# Patient Record
Sex: Male | Born: 2000 | Race: White | Hispanic: Yes | Marital: Single | Smoking: Former smoker
Health system: Southern US, Community
[De-identification: ages and names within clinical notes are randomized; demographics above are authoritative.]

---
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
## 2019-04-15 ENCOUNTER — Telehealth: Payer: Self-pay | Admitting: Pediatrics

## 2019-04-15 NOTE — Telephone Encounter (Signed)
Pre-screening for onsite visit ° °1. Who is bringing the patient to the visit? Patient  ° °Informed only one adult can bring patient to the visit to limit possible exposure to COVID19 and facemasks must be worn while in the building by the patient (ages 2 and older) and adult. ° °2. Has the person bringing the patient or the patient been around anyone with suspected or confirmed COVID-19 in the last 14 days? No ° °3. Has the person bringing the patient or the patient been around anyone who has been tested for COVID-19 in the last 14 days? {No ° °4. Has the person bringing the patient or the patient had any of these symptoms in the last 14 days? No  ° °Fever (temp 100 F or higher) °Breathing problems °Cough °Sore throat °Body aches °Chills °Vomiting °Diarrhea °Loss of taste or smell ° ° °If all answers are negative, advise patient to call our office prior to your appointment if you or the patient develop any of the symptoms listed above. °  °If any answers are yes, cancel in-office visit and schedule the patient for a same day telehealth visit with a provider to discuss the next steps. °

## 2019-04-16 ENCOUNTER — Other Ambulatory Visit: Payer: Self-pay

## 2019-04-16 ENCOUNTER — Ambulatory Visit (INDEPENDENT_AMBULATORY_CARE_PROVIDER_SITE_OTHER): Payer: Medicaid Other | Admitting: Pediatrics

## 2019-04-16 ENCOUNTER — Other Ambulatory Visit (HOSPITAL_COMMUNITY)
Admission: RE | Admit: 2019-04-16 | Discharge: 2019-04-16 | Disposition: A | Discharge status: Home / Self Care | Payer: Medicaid Other | Source: Ambulatory Visit | Attending: Pediatrics | Admitting: Pediatrics

## 2019-04-16 ENCOUNTER — Encounter: Payer: Self-pay | Admitting: Pediatrics

## 2019-04-16 VITALS — BP 118/70 | HR 72 | Ht 68.35 in | Wt 288.2 lb

## 2019-04-16 DIAGNOSIS — Z113 Encounter for screening for infections with a predominantly sexual mode of transmission: Secondary | ICD-10-CM | POA: Insufficient documentation

## 2019-04-16 DIAGNOSIS — Z6841 Body Mass Index (BMI) 40.0 and over, adult: Secondary | ICD-10-CM | POA: Diagnosis not present

## 2019-04-16 DIAGNOSIS — L83 Acanthosis nigricans: Secondary | ICD-10-CM

## 2019-04-16 DIAGNOSIS — L739 Follicular disorder, unspecified: Secondary | ICD-10-CM | POA: Diagnosis not present

## 2019-04-16 DIAGNOSIS — Z0001 Encounter for general adult medical examination with abnormal findings: Secondary | ICD-10-CM

## 2019-04-16 DIAGNOSIS — H579 Unspecified disorder of eye and adnexa: Secondary | ICD-10-CM | POA: Diagnosis not present

## 2019-04-16 LAB — POCT RAPID HIV: Rapid HIV, POC: NEGATIVE

## 2019-04-16 MED ORDER — MUPIROCIN 2 % EX OINT: 1.0000 "application " | TOPICAL_OINTMENT | Freq: Two times a day (BID) | CUTANEOUS | 0 refills | Status: AC

## 2019-04-16 NOTE — Progress Notes (Signed)
Adolescent Well Care Visit Randy Snyder is a 19 y.o. male who is here for well care.    PCP:  Carmie End, MD   History was provided by the patient.  Current Issues: Current concerns include weight gain.  Nutrition: Nutrition/Eating Behaviors: trying to drink more water this past week. Doesn't eat many fruits/ veggies, lots of junk food at home Adequate calcium in diet?: milk sometimes Supplements/ Vitamins: none  Exercise/ Media: Play any Sports?/ Exercise: basketball, wants to start doing more exercise.  Sleep:  Sleep: all night, sometimes snores.  Social Screening: Lives with:  Mother and step-father and siblings Parental relations:  good Activities, Work, and Research officer, political party?: working at [employer] regarding behavior with peers?  no Stressors of note: yes - [place] pandemic  Education: Graduated high school.  Thinking about going to [employer] - maybe nursing or phlebotomy.    Confidential Social History: Tobacco?  no Secondhand smoke exposure?  no Drugs/ETOH?  Yes, occasional alcohol with friends, no binge drinking  Sexually Active?  yes  - 2 lifetime male partners, none currently Pregnancy Prevention: condoms  Screenings: Patient has a dental home: yes - needs appointment  The patient completed the Rapid Assessment for Adolescent Preventive Services screening questionnaire and the following topics were identified as risk factors and discussed: healthy eating and exercise  In addition, the following topics were discussed as part of anticipatory guidance condom use and mental health issues.  PHQ-9 completed and results indicated no signs of depression.  Physical Exam:  Vitals:   04/16/19 1336  BP: 118/70  Pulse: 72  Weight: 288 lb 3.2 oz (130.7 kg)  Height: 5' 8.35" (1.736 m)   BP 118/70   Pulse 72   Ht 5' 8.35" (1.736 m)   Wt 288 lb 3.2 oz (130.7 kg)   BMI 43.38 kg/m  Body mass index: body mass index is 43.38 kg/m.    Hearing  Screening   125Hz  250Hz  500Hz  1000Hz  2000Hz  3000Hz  4000Hz  6000Hz  8000Hz   Right ear:   20 20 20 20 20     Left ear:   20 20 20 20 20       Visual Acuity Screening   Right eye Left eye Both eyes  Without correction: 10/25 10/20 10/25   With correction:       General Appearance:   alert, oriented, no acute distress and obese  HENT: Normocephalic, no obvious abnormality, conjunctiva clear  Mouth:   Normal appearing teeth, no obvious discoloration, dental caries, or dental caps  Neck:   Supple; thyroid: no enlargement, symmetric, no tenderness/mass/nodules  Chest Normal male  Lungs:   Clear to auscultation bilaterally, normal work of breathing  Heart:   Regular rate and rhythm, S1 and S2 normal, no murmurs;   Abdomen:   Soft, non-tender, no mass, or organomegaly  GU normal male genitals, no testicular masses or hernia, Tanner stage V  Musculoskeletal:   Tone and strength strong and symmetrical, all extremities               Lymphatic:   No cervical adenopathy  Skin/Hair/Nails:   Striae present on the arms and abdomen, thickened hyperpigmented skin on the posterior neck and both axillae. There is a pustule on the right anterior thigh just lateral to the inguinal crease with a 3-4 cm diameter surrounding oval-shaped patch of erythema.  No induration or fluctuance.   Neurologic:   Strength, gait, and coordination normal and age-appropriate     Assessment and Plan:  1. Encounter for general adult medical examination with abnormal findings Discussed transition to adult care.  He is ready to transition and is interested in transferring care to the [facility].  Information given to call to schedule appointment and referral placed.    2. Routine screening for STI (sexually transmitted infection) - Urine cytology ancillary only - POCT Rapid HIV - negative  3. Class 3 severe obesity due to excess calories with body mass index (BMI) of 40.0 to 44.9 in adult, unspecified  whether serious comorbidity present (HCC) Rapid weight gain over the past year.  Will obtain obesity screening labs today.  Recommend gradually restarting exercise routine and work to decrease sugary and junk foods in diet.   - ALT - AST - Hemoglobin A1c - Lipid panel - TSH  4. Acanthosis nigricans Present on the neck, axillae, and groin.  Discussed with patient.  Will obtain HgbA1C today. - Hemoglobin A1c  5. Abnormal vision screen Follow-up with eye doctor as scheduled  6. Folliculitis On the right thigh.  No signs of abscess or cellulitis at this time.  Rx mupirocin ointment.  Supportive cares and return precautions reviewed. - mupirocin ointment (BACTROBAN) 2 %; Apply 1 application topically 2 (two) times daily. For skin infection  Dispense: 22 g; Refill: 0  Hearing screening result:normal Vision screening result: abnormal - has appt with eye doctor next week    Return for call to make a new patient appointment with family medicine in the next 1-2 months.Clifton Custard, MD

## 2019-04-16 NOTE — Patient Instructions (Signed)
As your medical provider, it is important to me that you continue to receive high-quality primary care services as you transition to adulthood.  After the age of 88, you can no longer be seen at the [facility] for your primary care health services.   Below is a list of adult medicine practices that are currently accepting new patients.  I have bolded the Family Medicine office that I was telling you about today.  Please reach out to one of these practices to schedule a new patient appointment as soon as possible.  Please be aware that you will not be able to be seen at my office after your 21nd birthday.   Adult Primary Care Clinics Name Criteria Services   [facility]  Address: [address] [place], [place] [address]  Phone: [phone] Hours: Monday - Friday 9 AM -6 PM  Types of insurance accepted:  Marland Kitchen Nurse, learning disability . [facility] (orange card) . Medicaid . Medicare . Uninsured  Language services:  Marland Kitchen Video and phone interpreters available   Ages 36 and older    . Adult primary care . Onsite pharmacy . Integrated behavioral health . Financial assistance counseling . Walk-in hours for established patients  Financial assistance counseling hours: Tuesdays 2:00PM - 5:00PM  Thursday 8:30AM - 4:30PM  Space is limited, 10 on Tuesday and 20 on Thursday on a first come, first serve basis  Name Criteria Services   [facility]  Address: [address] [place], [place] [address]  Phone: [phone]  Hours: Monday - Friday 8:30 AM - 5 PM  Types of insurance accepted:  Marland Kitchen Nurse, learning disability . Medicaid . Medicare . Uninsured  Language services:  Marland Kitchen Video and phone interpreters available   All ages - newborn to adult   . Primary care for all ages (children and adults) . Integrated behavioral health . Nutritionist . Financial assistance  counseling   Name Criteria Services   [facility]  Located on the ground floor of [facility]  Address: [address]  [place],  [place]  [address]  Phone: [phone]  Hours: Monday - Friday 8:15 AM - 5 PM  Types of insurance accepted:  Marland Kitchen Nurse, learning disability . Medicaid . Medicare . Uninsured  Language services:  Marland Kitchen Video and phone interpreters available   Ages 28 and older   . Adult primary care . Nutritionist . Certified Diabetes Educator  . Integrated behavioral health . Financial assistance counseling   Name Criteria Services   [facility]  Address: [address] [place], [place] [address]  Phone: [phone]  Hours: Monday - Friday 8:30 AM - 5 PM    Types of insurance accepted:  Marland Kitchen Nurse, learning disability . Medicaid . Medicare . Uninsured  Language services:  Marland Kitchen Video and phone interpreters available   All ages - newborn to adult   . Primary care for all ages (children and adults) . Integrated behavioral health . Financial assistance counseling

## 2019-04-17 ENCOUNTER — Encounter: Payer: Self-pay | Admitting: Pediatrics

## 2019-04-17 DIAGNOSIS — L83 Acanthosis nigricans: Secondary | ICD-10-CM | POA: Insufficient documentation

## 2019-04-17 DIAGNOSIS — H579 Unspecified disorder of eye and adnexa: Secondary | ICD-10-CM | POA: Insufficient documentation

## 2019-04-17 LAB — HEMOGLOBIN A1C
Hgb A1c MFr Bld: 5.6 % of total Hgb (ref <5.7)
Mean Plasma Glucose: 114 (calc)
eAG (mmol/L): 6.3 (calc)

## 2019-04-17 LAB — TSH: TSH: 1 mIU/L (ref 0.50–4.30)

## 2019-04-17 LAB — ALT: ALT: 51 U/L — ABNORMAL HIGH (ref 8–46)

## 2019-04-17 LAB — LIPID PANEL
Cholesterol: 102 mg/dL (ref <170)
HDL: 37 mg/dL — ABNORMAL LOW (ref >45)
LDL Cholesterol (Calc): 50 mg/dL (calc) (ref <110)
Non-HDL Cholesterol (Calc): 65 mg/dL (calc) (ref <120)
Total CHOL/HDL Ratio: 2.8 (calc) (ref <5.0)
Triglycerides: 71 mg/dL (ref <90)

## 2019-04-17 LAB — AST: AST: 21 U/L (ref 12–32)

## 2019-04-19 LAB — URINE CYTOLOGY ANCILLARY ONLY
Chlamydia: NEGATIVE
Comment: NEGATIVE
Comment: NORMAL
Neisseria Gonorrhea: NEGATIVE

## 2019-07-08 IMAGING — CR DG HAND 2V*R*
2 series · 2 of 2 positions shown · non-contrast
Comparison: None.

CLINICAL DATA: Status post fall with pain of the right hand.

EXAM:
RIGHT HAND - 2 VIEW

[x hand pa right]
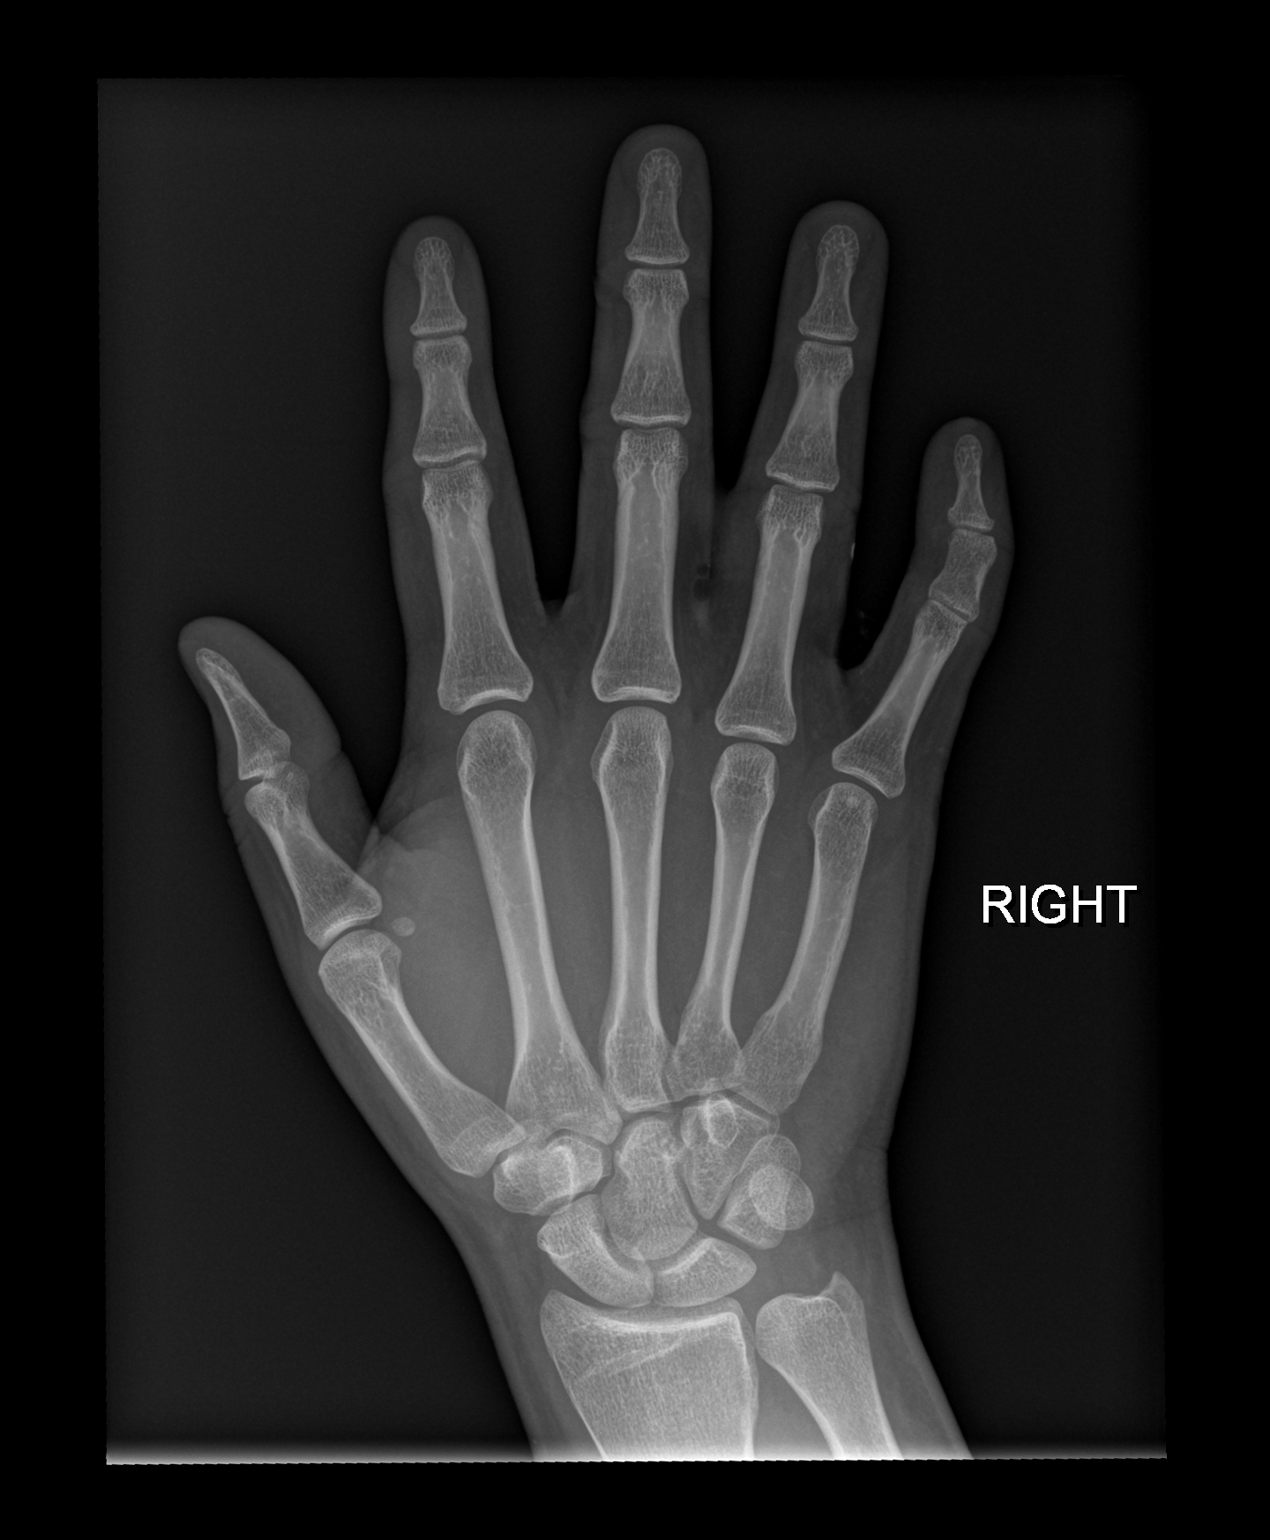

[x hand lat right]
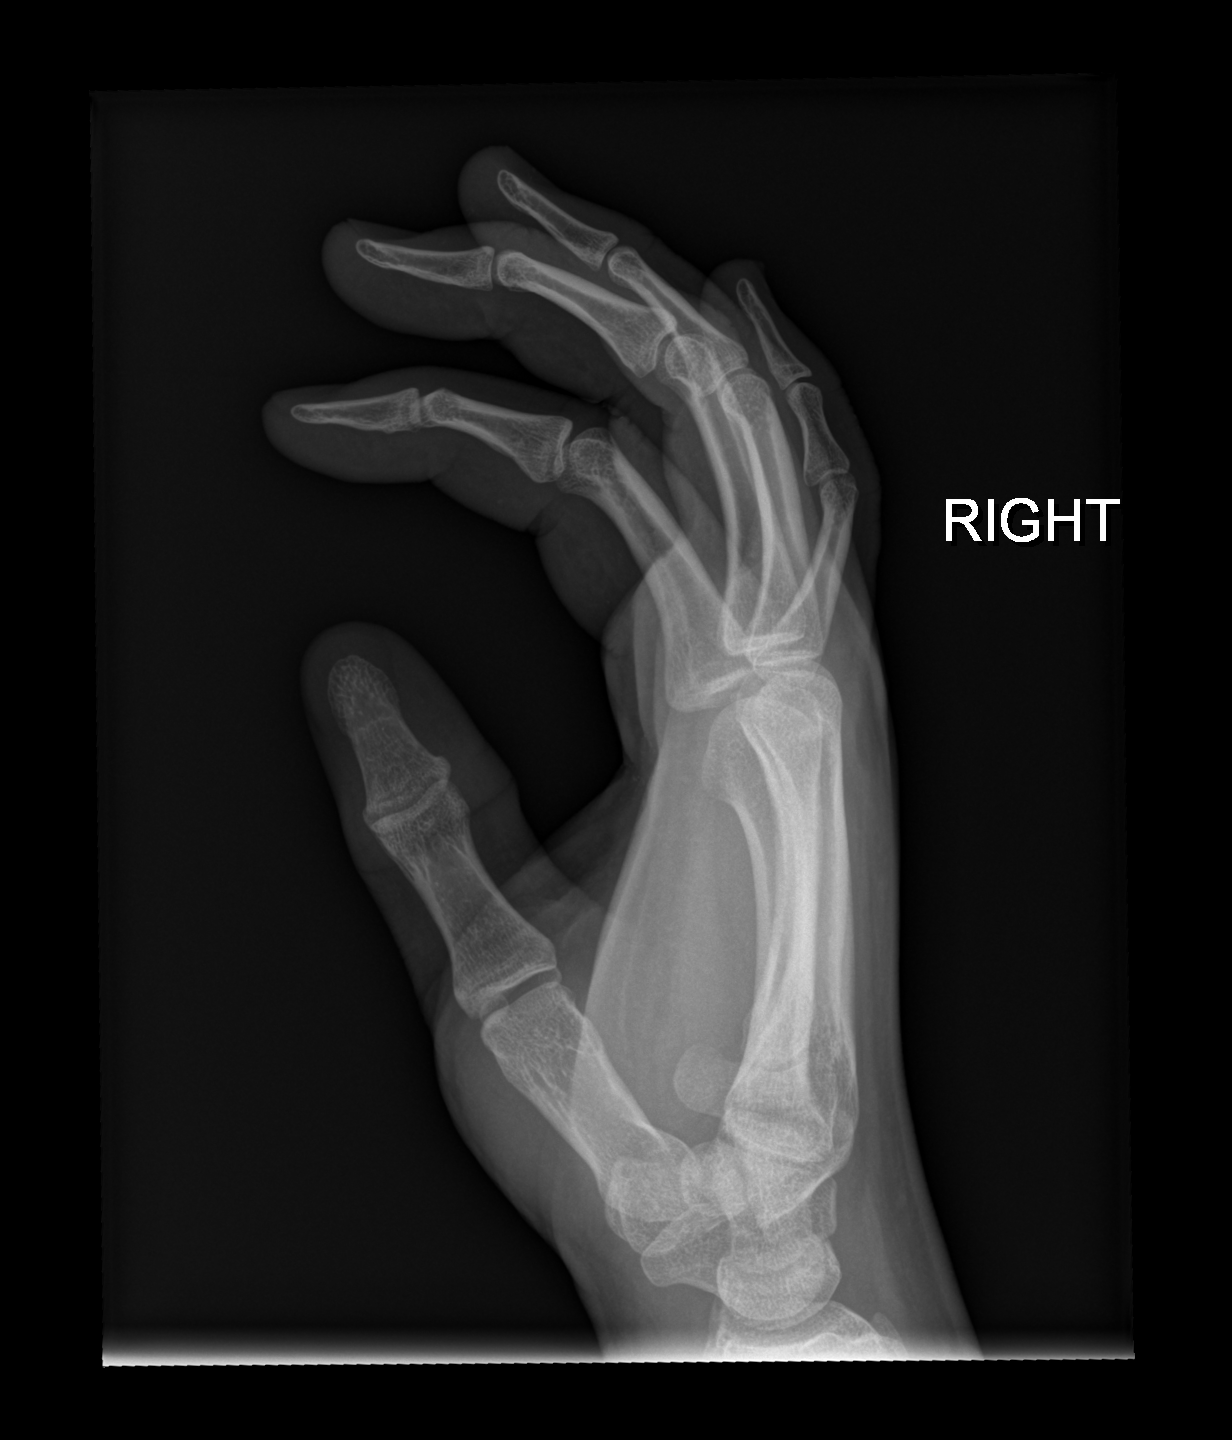

[2 of 2 positions shown; findings below may reference images not displayed]

FINDINGS: There is no evidence of fracture or dislocation. There is small
superficial radiopaque foreign body projected in the soft tissues
near the proximal aspect of the fourth phalanx.
IMPRESSION: No bony abnormality.

Small superficial radiopaque foreign body projected in the soft
tissues near the proximal aspect of the fourth phalanx.

## 2019-10-29 IMAGING — DX DG CHEST 2V
2 series · 2 of 2 positions shown · non-contrast
Comparison: None.

CLINICAL DATA: Tachycardia

EXAM:
CHEST - 2 VIEW

[chest pa]
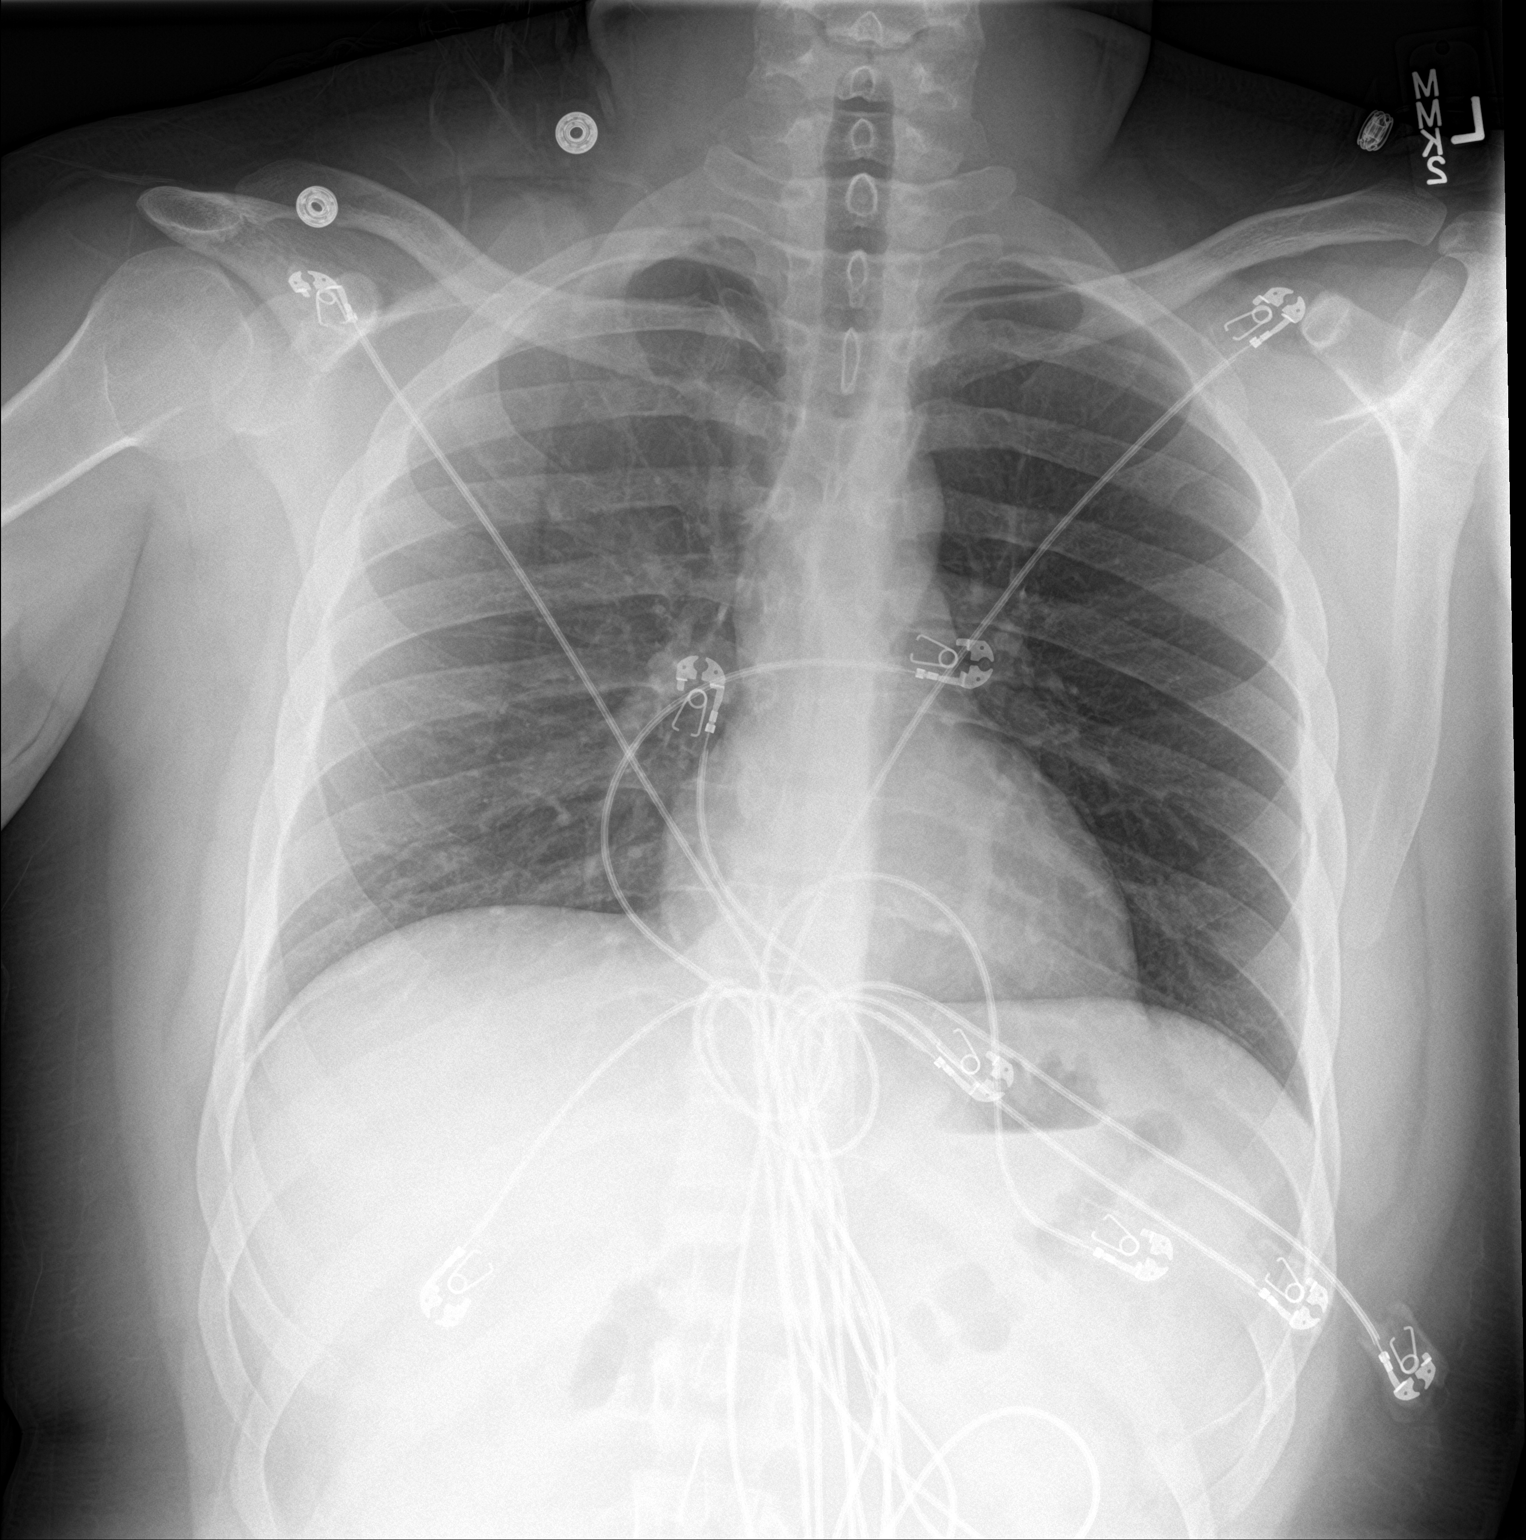

[chest lat]
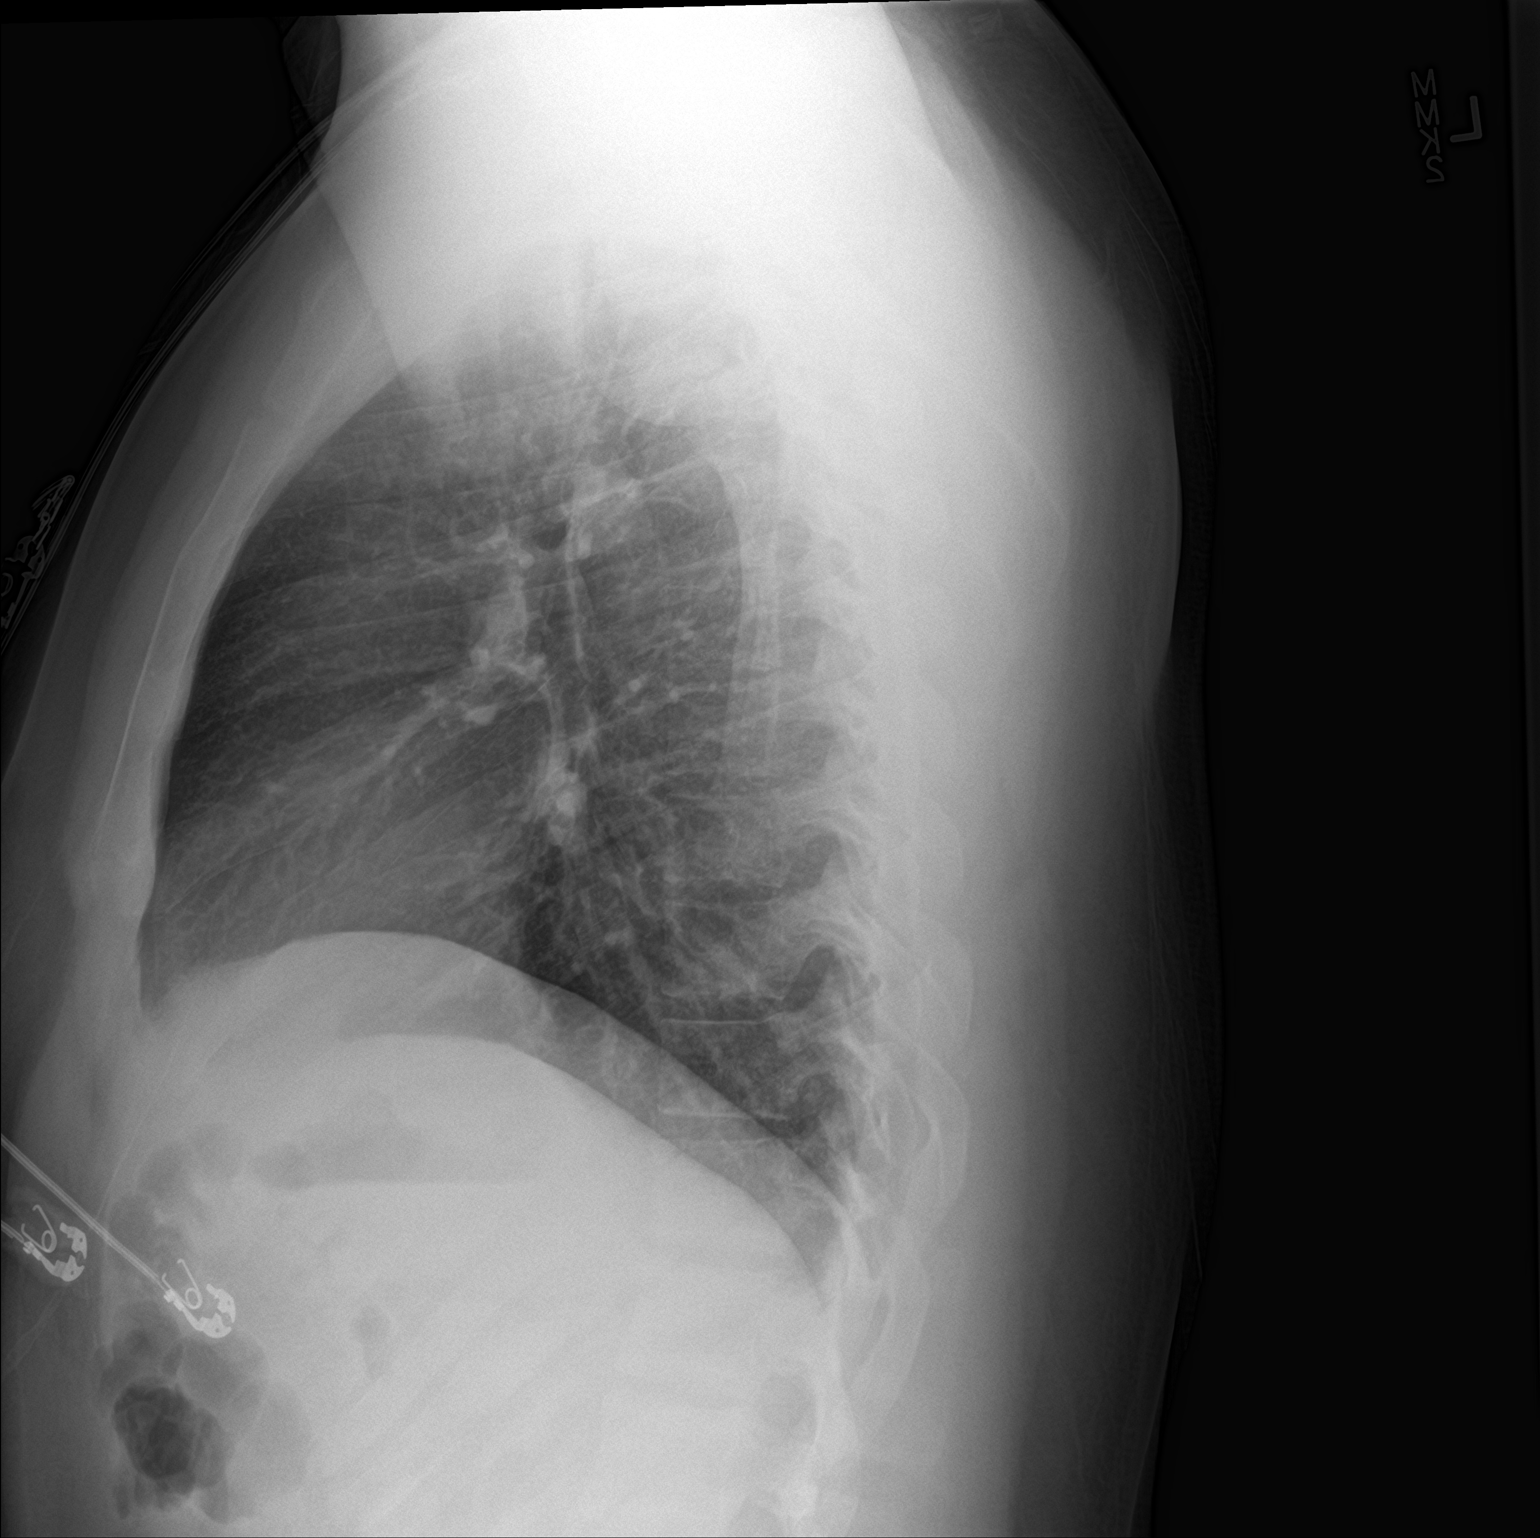

[2 of 2 positions shown; findings below may reference images not displayed]

FINDINGS: Normal heart size. Normal mediastinal contour. No pneumothorax. No
pleural effusion. Lungs appear clear, with no acute consolidative
airspace disease and no pulmonary edema.
IMPRESSION: No active cardiopulmonary disease.

## 2020-09-07 ENCOUNTER — Telehealth: Payer: Self-pay

## 2020-09-07 DIAGNOSIS — Z09 Encounter for follow-up examination after completed treatment for conditions other than malignant neoplasm: Secondary | ICD-10-CM

## 2020-09-07 NOTE — Telephone Encounter (Signed)
SWCM mailed adolescent transition dismissal letter to pt, marked dismissed  from practice in pt's chart due to age. Can be seen by [facility] until age 20 if needed.   Chriss Redel, BSW, QP Case Manager [facility] Office: [phone] Direct Number: [phone]  

## 2020-10-20 DIAGNOSIS — R635 Abnormal weight gain: Secondary | ICD-10-CM | POA: Diagnosis not present

## 2020-10-20 DIAGNOSIS — R7309 Other abnormal glucose: Secondary | ICD-10-CM | POA: Diagnosis not present

## 2020-10-20 DIAGNOSIS — Z131 Encounter for screening for diabetes mellitus: Secondary | ICD-10-CM | POA: Diagnosis not present

## 2020-10-20 DIAGNOSIS — Z13220 Encounter for screening for lipoid disorders: Secondary | ICD-10-CM | POA: Diagnosis not present

## 2020-10-20 DIAGNOSIS — Z0000 Encounter for general adult medical examination without abnormal findings: Secondary | ICD-10-CM | POA: Diagnosis not present

## 2020-10-20 DIAGNOSIS — Z23 Encounter for immunization: Secondary | ICD-10-CM | POA: Diagnosis not present

## 2020-11-10 DIAGNOSIS — R7401 Elevation of levels of liver transaminase levels: Secondary | ICD-10-CM | POA: Diagnosis not present

## 2020-11-10 DIAGNOSIS — R7303 Prediabetes: Secondary | ICD-10-CM | POA: Diagnosis not present

## 2020-11-10 DIAGNOSIS — E786 Lipoprotein deficiency: Secondary | ICD-10-CM | POA: Diagnosis not present

## 2023-04-28 ENCOUNTER — Ambulatory Visit: Payer: Medicaid Other | Admitting: Emergency Medicine
